# Patient Record
Sex: Female | Born: 1945 | Race: Black or African American | Hispanic: No | Marital: Married | Smoking: Never smoker
Health system: Southern US, Community
[De-identification: ages and names within clinical notes are randomized; demographics above are authoritative.]

## PROBLEM LIST (undated history)

## (undated) DIAGNOSIS — M810 Age-related osteoporosis without current pathological fracture: Secondary | ICD-10-CM

## (undated) DIAGNOSIS — D509 Iron deficiency anemia, unspecified: Secondary | ICD-10-CM

## (undated) DIAGNOSIS — I10 Essential (primary) hypertension: Secondary | ICD-10-CM

## (undated) DIAGNOSIS — E876 Hypokalemia: Secondary | ICD-10-CM

## (undated) DIAGNOSIS — C50919 Malignant neoplasm of unspecified site of unspecified female breast: Secondary | ICD-10-CM

## (undated) DIAGNOSIS — K219 Gastro-esophageal reflux disease without esophagitis: Secondary | ICD-10-CM

## (undated) HISTORY — PX: BREAST SURGERY: SHX581

## (undated) HISTORY — PX: CHOLECYSTECTOMY: SHX55

## (undated) HISTORY — PX: ABDOMINAL HYSTERECTOMY: SHX81

## (undated) HISTORY — PX: KNEE SURGERY: SHX244

---

## 2002-08-18 ENCOUNTER — Encounter: Admission: RE | Admit: 2002-08-18 | Discharge: 2002-08-18 | Payer: Self-pay | Admitting: Internal Medicine

## 2002-08-18 ENCOUNTER — Encounter: Payer: Self-pay | Admitting: Internal Medicine

## 2006-06-27 ENCOUNTER — Encounter: Admission: RE | Admit: 2006-06-27 | Discharge: 2006-06-27 | Payer: Self-pay | Admitting: Internal Medicine

## 2010-03-19 ENCOUNTER — Ambulatory Visit: Payer: Self-pay | Admitting: Diagnostic Radiology

## 2010-03-19 ENCOUNTER — Emergency Department (HOSPITAL_BASED_OUTPATIENT_CLINIC_OR_DEPARTMENT_OTHER): Admission: EM | Admit: 2010-03-19 | Discharge: 2010-03-19 | Payer: Self-pay | Admitting: Emergency Medicine

## 2014-07-09 ENCOUNTER — Emergency Department (HOSPITAL_BASED_OUTPATIENT_CLINIC_OR_DEPARTMENT_OTHER)
Admission: EM | Admit: 2014-07-09 | Discharge: 2014-07-10 | Disposition: A | Discharge status: Home / Self Care | Payer: Medicare Other | Attending: Emergency Medicine | Admitting: Emergency Medicine

## 2014-07-09 ENCOUNTER — Emergency Department (HOSPITAL_BASED_OUTPATIENT_CLINIC_OR_DEPARTMENT_OTHER): Payer: Medicare Other

## 2014-07-09 ENCOUNTER — Encounter (HOSPITAL_BASED_OUTPATIENT_CLINIC_OR_DEPARTMENT_OTHER): Payer: Self-pay

## 2014-07-09 DIAGNOSIS — S3992XA Unspecified injury of lower back, initial encounter: Secondary | ICD-10-CM | POA: Insufficient documentation

## 2014-07-09 DIAGNOSIS — M5441 Lumbago with sciatica, right side: Secondary | ICD-10-CM | POA: Insufficient documentation

## 2014-07-09 DIAGNOSIS — X58XXXA Exposure to other specified factors, initial encounter: Secondary | ICD-10-CM | POA: Insufficient documentation

## 2014-07-09 DIAGNOSIS — Y998 Other external cause status: Secondary | ICD-10-CM | POA: Insufficient documentation

## 2014-07-09 DIAGNOSIS — I10 Essential (primary) hypertension: Secondary | ICD-10-CM | POA: Insufficient documentation

## 2014-07-09 DIAGNOSIS — Z853 Personal history of malignant neoplasm of breast: Secondary | ICD-10-CM | POA: Diagnosis not present

## 2014-07-09 DIAGNOSIS — R52 Pain, unspecified: Secondary | ICD-10-CM

## 2014-07-09 DIAGNOSIS — D509 Iron deficiency anemia, unspecified: Secondary | ICD-10-CM | POA: Insufficient documentation

## 2014-07-09 DIAGNOSIS — Y92003 Bedroom of unspecified non-institutional (private) residence as the place of occurrence of the external cause: Secondary | ICD-10-CM | POA: Insufficient documentation

## 2014-07-09 DIAGNOSIS — Y9389 Activity, other specified: Secondary | ICD-10-CM | POA: Insufficient documentation

## 2014-07-09 DIAGNOSIS — Z8719 Personal history of other diseases of the digestive system: Secondary | ICD-10-CM | POA: Insufficient documentation

## 2014-07-09 DIAGNOSIS — E876 Hypokalemia: Secondary | ICD-10-CM | POA: Diagnosis not present

## 2014-07-09 DIAGNOSIS — Z79899 Other long term (current) drug therapy: Secondary | ICD-10-CM | POA: Insufficient documentation

## 2014-07-09 DIAGNOSIS — S79911A Unspecified injury of right hip, initial encounter: Secondary | ICD-10-CM | POA: Diagnosis present

## 2014-07-09 HISTORY — DX: Age-related osteoporosis without current pathological fracture: M81.0

## 2014-07-09 HISTORY — DX: Hypokalemia: E87.6

## 2014-07-09 HISTORY — DX: Iron deficiency anemia, unspecified: D50.9

## 2014-07-09 HISTORY — DX: Gastro-esophageal reflux disease without esophagitis: K21.9

## 2014-07-09 HISTORY — DX: Malignant neoplasm of unspecified site of unspecified female breast: C50.919

## 2014-07-09 HISTORY — DX: Essential (primary) hypertension: I10

## 2014-07-09 MED ORDER — KETOROLAC TROMETHAMINE 60 MG/2ML IM SOLN
30.0000 mg | Freq: Once | INTRAMUSCULAR | Status: AC
  Administered 2014-07-09: 30 mg via INTRAMUSCULAR
  Filled 2014-07-09: qty 2

## 2014-07-09 NOTE — ED Notes (Signed)
Pt reports last night awoke from sleep with pain in right hip area, reports unable to walk well d/t pain in left hip "grabbing" her.  Denies n/t or loss of bowel/bladder.  No trauma.

## 2014-07-09 NOTE — Discharge Instructions (Signed)
Back Pain, Adult °Back pain is very common. The pain often gets better over time. The cause of back pain is usually not dangerous. Most people can learn to manage their back pain on their own.  °HOME CARE  °· Stay active. Start with short walks on flat ground if you can. Try to walk farther each day. °· Do not sit, drive, or stand in one place for more than 30 minutes. Do not stay in bed. °· Do not avoid exercise or work. Activity can help your back heal faster. °· Be careful when you bend or lift an object. Bend at your knees, keep the object close to you, and do not twist. °· Sleep on a firm mattress. Lie on your side, and bend your knees. If you lie on your back, put a pillow under your knees. °· Only take medicines as told by your doctor. °· Put ice on the injured area. °¨ Put ice in a plastic bag. °¨ Place a towel between your skin and the bag. °¨ Leave the ice on for 15-20 minutes, 03-04 times a day for the first 2 to 3 days. After that, you can switch between ice and heat packs. °· Ask your doctor about back exercises or massage. °· Avoid feeling anxious or stressed. Find good ways to deal with stress, such as exercise. °GET HELP RIGHT AWAY IF:  °· Your pain does not go away with rest or medicine. °· Your pain does not go away in 1 week. °· You have new problems. °· You do not feel well. °· The pain spreads into your legs. °· You cannot control when you poop (bowel movement) or pee (urinate). °· Your arms or legs feel weak or lose feeling (numbness). °· You feel sick to your stomach (nauseous) or throw up (vomit). °· You have belly (abdominal) pain. °· You feel like you may pass out (faint). °MAKE SURE YOU:  °· Understand these instructions. °· Will watch your condition. °· Will get help right away if you are not doing well or get worse. °Document Released: 10/09/2007 Document Revised: 07/15/2011 Document Reviewed: 08/24/2013 °Exit[employer]® Patient Information ©2015 [employer]. This information is not intended  to replace advice given to you by your health care provider. Make sure you discuss any questions you have with your health care provider. ° °

## 2014-07-09 NOTE — ED Provider Notes (Signed)
CSN: [id number]     Arrival date & time 07/09/14  1944 History  This chart was scribed for Malvin Johns, MD by Dellis Filbert, ED Scribe. The patient was seen in [facility] and the patient's care was started at 10:31 PM.  Chief Complaint  Patient presents with  . Hip Pain   Patient is a 69 y.o. female presenting with hip pain. The history is provided by the patient. No language interpreter was used.  Hip Pain Pertinent negatives include no chest pain, no abdominal pain, no headaches and no shortness of breath.   HPI Comments: Carol Long is a 69 y.o. female who presents to the Emergency Department complaining of new right hip pain acute onset last night. Pt was in bed, when she turned over she aggravated her right hip. The pain does not radiate down her leg. Pain is worse with standing and walking. She does not have pain when sitting or laying down. She has taken nothing for relief. Pt denies numbness or weakness in her leg, urine/bowel incontinence, and abdominal pain. No kidney problems.  Past Medical History  Diagnosis Date  . Hypertension   . Hypokalemia   . Iron deficiency anemia   . Breast cancer   . GERD (gastroesophageal reflux disease)   . Osteoporosis    Past Surgical History  Procedure Laterality Date  . Breast surgery    . Abdominal hysterectomy    . Cholecystectomy    . Knee surgery     No family history on file. History  Substance Use Topics  . Smoking status: Never Smoker   . Smokeless tobacco: Not on file  . Alcohol Use: No   OB History    No data available     Review of Systems  Constitutional: Negative for fever, chills, diaphoresis and fatigue.  HENT: Negative for congestion, rhinorrhea and sneezing.   Eyes: Negative.   Respiratory: Negative for cough, chest tightness and shortness of breath.   Cardiovascular: Negative for chest pain and leg swelling.  Gastrointestinal: Negative for nausea, vomiting, abdominal pain, diarrhea and blood in stool.   Genitourinary: Negative for frequency, hematuria, flank pain and difficulty urinating.  Musculoskeletal: Positive for back pain and gait problem (pain with ambulation). Negative for arthralgias.  Skin: Negative for rash.  Neurological: Negative for dizziness, speech difficulty, weakness, numbness and headaches.   Allergies  Codeine; Hydrocodone; and Other  Home Medications   Prior to Admission medications   Medication Sig Start Date End Date Taking? Authorizing Provider  ferrous sulfate 325 (65 FE) MG tablet Take 325 mg by mouth daily with breakfast.   Yes Historical Provider, MD  losartan (COZAAR) 100 MG tablet Take 100 mg by mouth daily.   Yes Historical Provider, MD  potassium chloride (K-DUR) 10 MEQ tablet Take 10 mEq by mouth daily.   Yes Historical Provider, MD   BP 148/78 mmHg  Pulse 81  Temp(Src) 98.1 F (36.7 C) (Oral)  Resp 18  Ht 5\' 4"  (1.626 m)  Wt 225 lb (102.059 kg)  BMI 38.60 kg/m2  SpO2 100% Physical Exam  Constitutional: She is oriented to person, place, and time. She appears well-developed and well-nourished.  HENT:  Head: Normocephalic and atraumatic.  Eyes: Pupils are equal, round, and reactive to light.  Neck: Normal range of motion. Neck supple.  Cardiovascular: Normal rate, regular rhythm and normal heart sounds.   Pulmonary/Chest: Effort normal and breath sounds normal. No respiratory distress. She has no wheezes. She has no rales. She exhibits no  tenderness.  Abdominal: Soft. Bowel sounds are normal. There is no tenderness. There is no rebound and no guarding.  Musculoskeletal: Normal range of motion. She exhibits no edema.  There is positive tenderness to the posterior hip along the sciatic nerve area. There some tenderness around the SI joint area. There is no pain on range of motion the hip joint. There is no pain to the knee. She has normal sensation in the leg. There is normal motor function in the leg. There is no swelling of the leg.    Lymphadenopathy:    She has no cervical adenopathy.  Neurological: She is alert and oriented to person, place, and time.  Skin: Skin is warm and dry. No rash noted.  Psychiatric: She has a normal mood and affect.    ED Course  Procedures  DIAGNOSTIC STUDIES: Oxygen Saturation is 100% on room air, normal by my interpretation.    COORDINATION OF CARE: 10:43 PM Discussed treatment plan with pt at bedside and pt agreed to plan.  Labs Review Labs Reviewed - No data to display  Imaging Review Dg Hip Unilat With Pelvis 1v Right  07/09/2014   CLINICAL DATA:  Posterior right hip pain for 1 day. No known injury.  EXAM: RIGHT HIP (WITH PELVIS) 1 VIEW  COMPARISON:  06/24/2011 radiographs  FINDINGS: There is no evidence of acute fracture, subluxation or dislocation.  Mild degenerative changes in both hips noted.  Subchondral sclerosis along both SI joints, right greater than left, noted. No definite erosions along the SI joints are noted.  No other focal bony abnormalities are noted.  IMPRESSION: No evidence of acute bony abnormality.  Bilateral SI joint subchondral sclerosis without definite erosions. Question degenerative change versus sacroiliitis.  Mild degenerative changes in both hips.   Electronically Signed   By: Margarette Canada M.D.   On: 07/09/2014 21:45     EKG Interpretation None      MDM   Final diagnoses:  Right-sided low back pain with right-sided sciatica   Patient is given dose of Toradol in the ED. She doesn't want to use narcotic medicines because they make her sick. She will use ibuprofen at home for symptomatic relief. She was advised to follow-up with her primary care physician or orthopedist if her symptoms continue or return here as needed for any worsening symptoms. She currently has no neurologic dysfunctions or evidence of cauda equina. She has no bony tenderness in the hip joint or evidence of hip fracture.  I personally performed the services described in this  documentation, which was scribed in my presence.  The recorded information has been reviewed and considered.      Malvin Johns, MD 07/09/14 [phone]

## 2016-03-31 IMAGING — CR DG HIP (WITH OR WITHOUT PELVIS) 1V*R*
3 series · 3 of 3 positions shown · non-contrast
Comparison: 06/24/2011 radiographs

CLINICAL DATA: Posterior right hip pain for 1 day. No known injury.

EXAM:
RIGHT HIP (WITH PELVIS) 1 VIEW

[t pelvis a.p.]
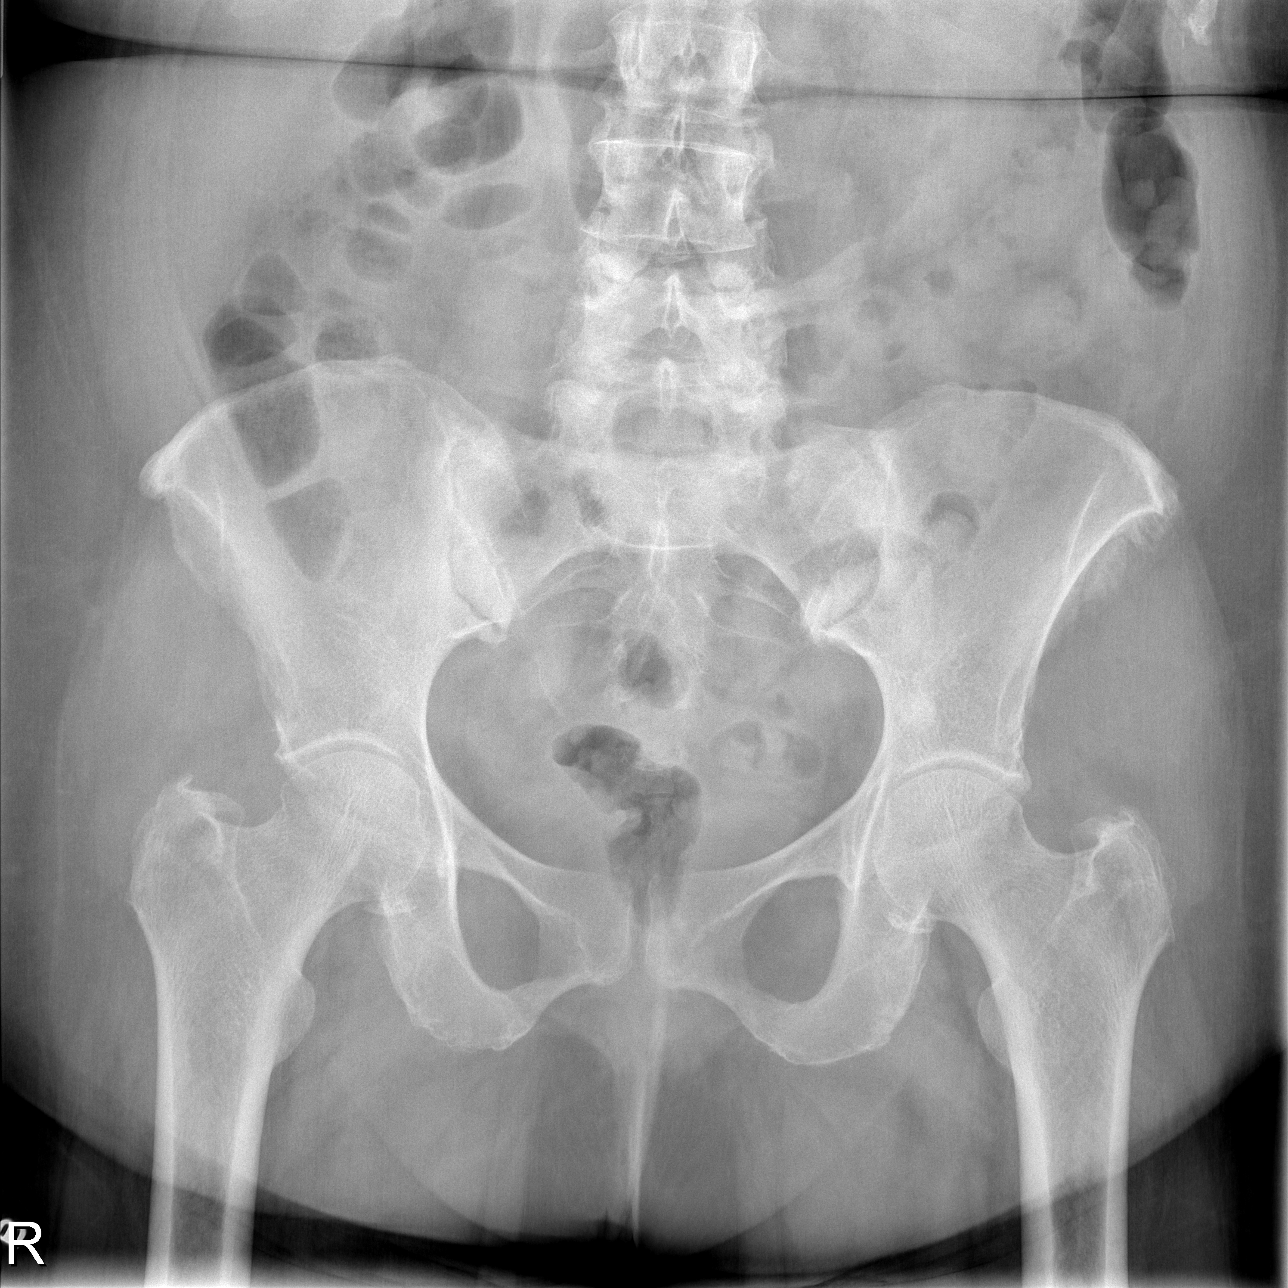

[t hip ap right]
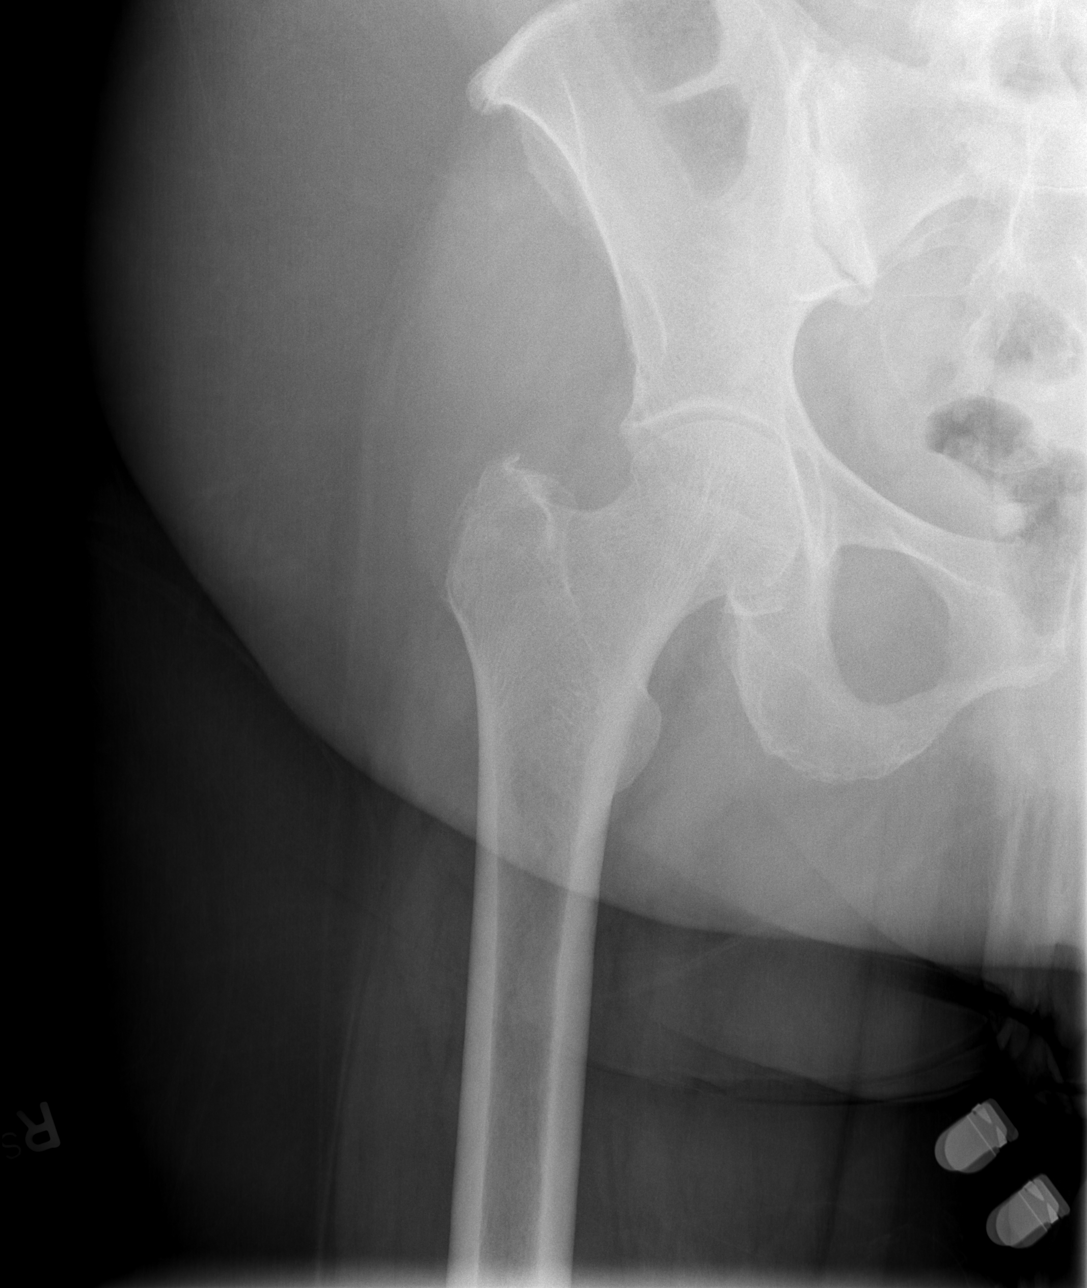

[t hip frog leg right]
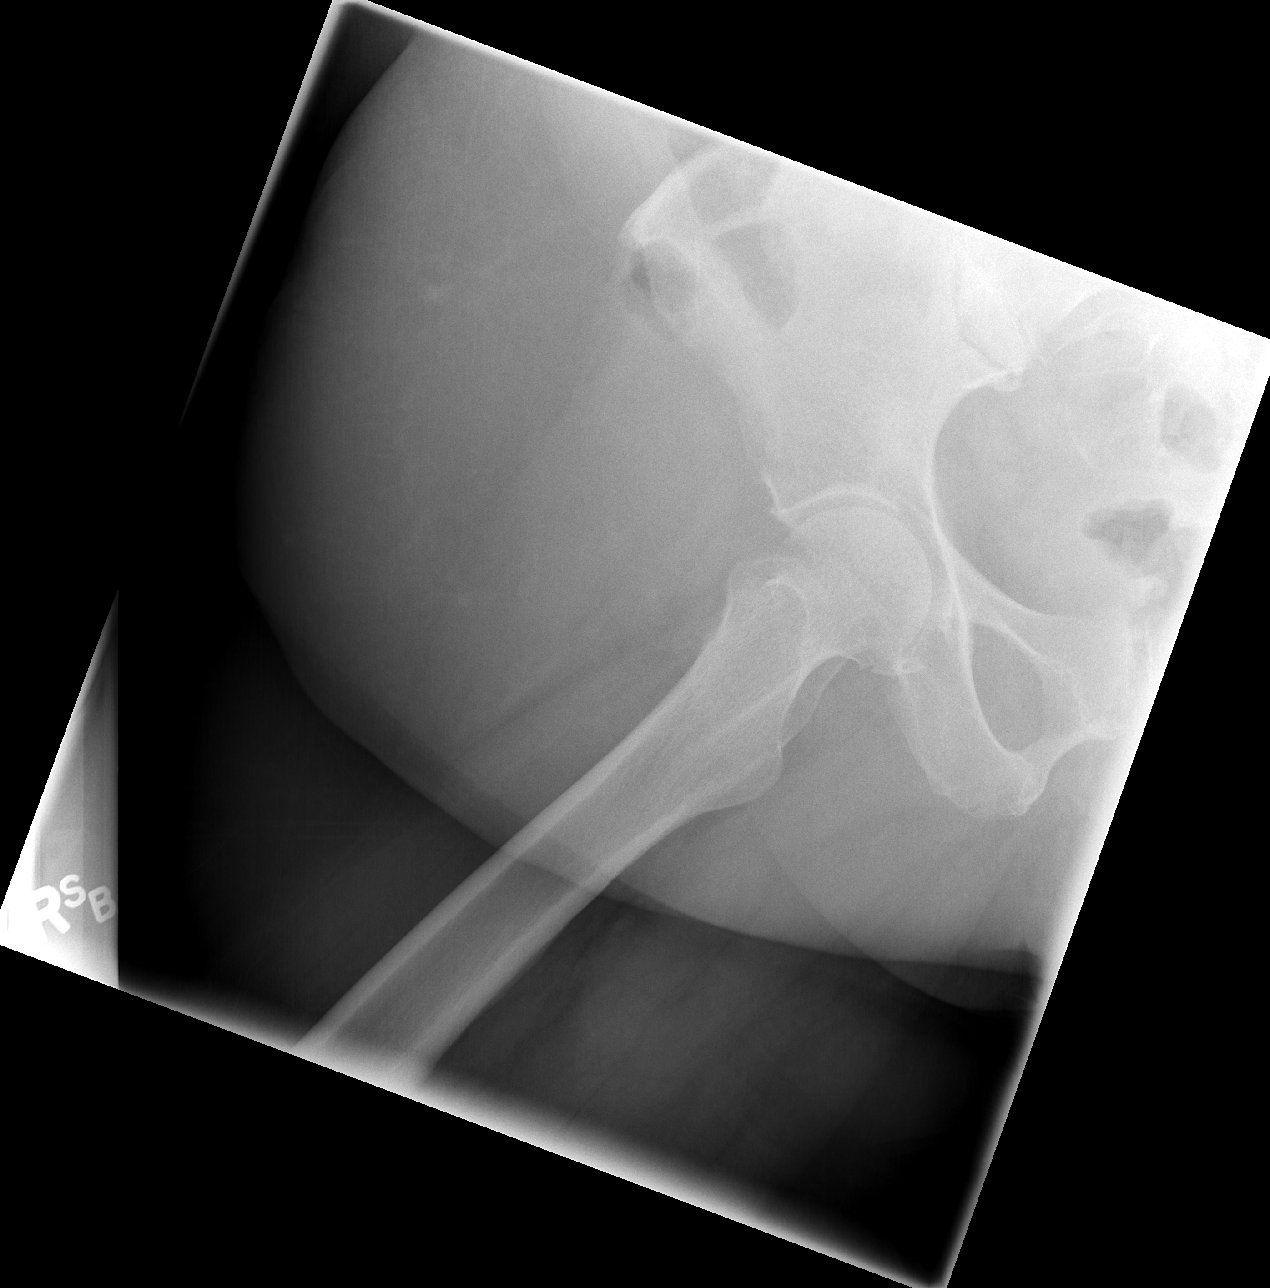

[3 of 3 positions shown; findings below may reference images not displayed]

FINDINGS: There is no evidence of acute fracture, subluxation or dislocation.

Mild degenerative changes in both hips noted.

Subchondral sclerosis along both SI joints, right greater than left,
noted. No definite erosions along the SI joints are noted.

No other focal bony abnormalities are noted.
IMPRESSION: No evidence of acute bony abnormality.

Bilateral SI joint subchondral sclerosis without definite erosions.
Question degenerative change versus sacroiliitis.

Mild degenerative changes in both hips.

## 2016-07-05 ENCOUNTER — Encounter (HOSPITAL_BASED_OUTPATIENT_CLINIC_OR_DEPARTMENT_OTHER): Payer: Self-pay | Admitting: *Deleted

## 2016-07-05 ENCOUNTER — Emergency Department (HOSPITAL_BASED_OUTPATIENT_CLINIC_OR_DEPARTMENT_OTHER)
Admission: EM | Admit: 2016-07-05 | Discharge: 2016-07-05 | Disposition: A | Discharge status: Home / Self Care | Payer: Medicare Other | Attending: Emergency Medicine | Admitting: Emergency Medicine

## 2016-07-05 ENCOUNTER — Emergency Department (HOSPITAL_BASED_OUTPATIENT_CLINIC_OR_DEPARTMENT_OTHER): Payer: Medicare Other

## 2016-07-05 DIAGNOSIS — I10 Essential (primary) hypertension: Secondary | ICD-10-CM | POA: Diagnosis not present

## 2016-07-05 DIAGNOSIS — Z853 Personal history of malignant neoplasm of breast: Secondary | ICD-10-CM | POA: Insufficient documentation

## 2016-07-05 DIAGNOSIS — M1711 Unilateral primary osteoarthritis, right knee: Secondary | ICD-10-CM | POA: Diagnosis not present

## 2016-07-05 DIAGNOSIS — M79604 Pain in right leg: Secondary | ICD-10-CM

## 2016-07-05 DIAGNOSIS — Z79899 Other long term (current) drug therapy: Secondary | ICD-10-CM | POA: Diagnosis not present

## 2016-07-05 MED ORDER — ACETAMINOPHEN 500 MG PO TABS
1000.0000 mg | ORAL_TABLET | Freq: Once | ORAL | Status: AC
  Administered 2016-07-05: 1000 mg via ORAL
  Filled 2016-07-05: qty 2

## 2016-07-05 NOTE — ED Notes (Signed)
ED Provider at bedside. 

## 2016-07-05 NOTE — ED Triage Notes (Signed)
Pt reports ongoing  Problems with right leg. Sees Dr. Linton Rump. C/o right leg pain and "locking up", worse x 3 days

## 2016-07-05 NOTE — ED Provider Notes (Signed)
[place] [facility] Provider Note   CSN: [id number] Arrival date & time: 07/05/16  0911     History   Chief Complaint Chief Complaint  Patient presents with  . Leg Pain    HPI Carol Long is a 71 y.o. female.  Patient c/o right anterior lower leg pain in past day. Pain constant, dull, mild-mod, worse w weight bearing and/or walking.  No numbness/weakness. No swelling. No skin changes, redness, or lesions. Denies posterior/calf pain. No claudication. No fever or chills. No trauma or fall.     Leg Pain   Pertinent negatives include no numbness.    Past Medical History:  Diagnosis Date  . Breast cancer ([place])   . GERD (gastroesophageal reflux disease)   . Hypertension   . Hypokalemia   . Iron deficiency anemia   . Osteoporosis     There are no active problems to display for this patient.   Past Surgical History:  Procedure Laterality Date  . ABDOMINAL HYSTERECTOMY    . BREAST SURGERY    . CHOLECYSTECTOMY    . KNEE SURGERY      OB History    No data available       Home Medications    Prior to Admission medications   Medication Sig Start Date End Date Taking? Authorizing Provider  losartan (COZAAR) 100 MG tablet Take 100 mg by mouth daily.   Yes Historical Provider, MD  potassium chloride (K-DUR) 10 MEQ tablet Take 10 mEq by mouth daily.   Yes Historical Provider, MD  ferrous sulfate 325 (65 FE) MG tablet Take 325 mg by mouth daily with breakfast.    Historical Provider, MD    Family History No family history on file.  Social History Social History  Substance Use Topics  . Smoking status: Never Smoker  . Smokeless tobacco: Never Used  . Alcohol use No     Allergies   Codeine; Hydrocodone; and Other   Review of Systems Review of Systems  Constitutional: Negative for fever.  Respiratory: Negative for shortness of breath.   Cardiovascular: Negative for chest pain and leg swelling.  Skin: Negative for rash.  Neurological: Negative for  weakness and numbness.     Physical Exam Updated Vital Signs BP 151/75 (BP Location: Right Arm)   Pulse 72   Temp 97.9 F (36.6 C) (Oral)   Resp 18   Ht 5\' 4"  (1.626 m)   Wt 95.7 kg   SpO2 99%   BMI 36.22 kg/m   Physical Exam  Constitutional: She appears well-developed and well-nourished. No distress.  Eyes: Conjunctivae are normal. No scleral icterus.  Neck: Neck supple. No tracheal deviation present.  Cardiovascular: Normal rate and intact distal pulses.   Pulmonary/Chest: Effort normal. No respiratory distress.  Abdominal: Normal appearance.  Musculoskeletal: She exhibits no edema.  Mild tenderness right anterior lower leg. No sts noted. No skin changes or lesions. Compartments of lower leg are soft, not tense. No pain w rom at knee or ankle. Dp/pt 2+.  Neurological: She is alert.  Motor/sens grossly intact. Ambulates w steady gait.   Skin: Skin is warm and dry. No rash noted. She is not diaphoretic.  Psychiatric: She has a normal mood and affect.  Nursing note and vitals reviewed.    ED Treatments / Results  Labs (all labs ordered are listed, but only abnormal results are displayed) Labs Reviewed - No data to display  EKG  EKG Interpretation None       Radiology Dg Tibia/fibula  Right  Result Date: 07/05/2016 CLINICAL DATA:  Diffuse pain EXAM: RIGHT TIBIA AND FIBULA - 2 VIEW COMPARISON:  None. FINDINGS: Frontal and lateral views obtained. There is soft tissue swelling in the lateral and anterior ankle regions. There is no acute fracture or dislocation. No abnormal periosteal reaction. There is moderate osteoarthritic change in the knee and ankle joint regions. A small calcification medial to the proximal tibia may represent residua of old trauma. There are inferior and posterior calcaneal spurs. IMPRESSION: No demonstrable acute fracture or dislocation. Rather marked soft tissue swelling in the lateral ankle region. Question old trauma medial to the proximal tibia.  There is osteoarthritic change in the knee and ankle joint regions with rather severe narrowing in the medial compartment of the knee. Calcaneal spurs present. Electronically Signed   By: Lowella Grip III M.D.   On: 07/05/2016 09:53    Procedures Procedures (including critical care time)  Medications Ordered in ED Medications  acetaminophen (TYLENOL) tablet 1,000 mg (not administered)     Initial Impression / Assessment and Plan / ED Course  I have reviewed the triage vital signs and the nursing notes.  Pertinent labs & imaging results that were available during my care of the patient were reviewed by me and considered in my medical decision making (see chart for details).  Had ibuprofen earlier.  Xrays.  Tylenol po.  Xray with OA right knee.  Patient currently appears stable for d/c.     Final Clinical Impressions(s) / ED Diagnoses   Final diagnoses:  None    New Prescriptions New Prescriptions   No medications on file     Lajean Saver, MD 07/05/16 1000

## 2016-07-05 NOTE — Discharge Instructions (Signed)
It was our pleasure to provide your ER care today - we hope that you feel better.  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  Take ibuprofen or aleve as need for pain.   You may also take acetaminophen as need for pain.  Follow up with your orthopedic doctor in 1-2 weeks if symptoms fail to improve/resolve.  Return to ER if worse, new symptoms, fevers, severe swelling, other concern.

## 2018-04-18 ENCOUNTER — Other Ambulatory Visit: Payer: Self-pay

## 2018-04-18 ENCOUNTER — Encounter (HOSPITAL_BASED_OUTPATIENT_CLINIC_OR_DEPARTMENT_OTHER): Payer: Self-pay | Admitting: Emergency Medicine

## 2018-04-18 ENCOUNTER — Emergency Department (HOSPITAL_BASED_OUTPATIENT_CLINIC_OR_DEPARTMENT_OTHER): Payer: Medicare Other

## 2018-04-18 DIAGNOSIS — Z853 Personal history of malignant neoplasm of breast: Secondary | ICD-10-CM | POA: Insufficient documentation

## 2018-04-18 DIAGNOSIS — Z79899 Other long term (current) drug therapy: Secondary | ICD-10-CM | POA: Insufficient documentation

## 2018-04-18 DIAGNOSIS — I10 Essential (primary) hypertension: Secondary | ICD-10-CM | POA: Diagnosis not present

## 2018-04-18 DIAGNOSIS — R05 Cough: Secondary | ICD-10-CM | POA: Insufficient documentation

## 2018-04-18 NOTE — ED Triage Notes (Signed)
Patient states that she has had a cough for the last 3 weeks - she reports that in the last 2 -3 days her PMD called something new in for her but the cough is worse and now she is having chest pain and sweating at night

## 2018-04-19 ENCOUNTER — Other Ambulatory Visit: Payer: Self-pay

## 2018-04-19 ENCOUNTER — Emergency Department (HOSPITAL_BASED_OUTPATIENT_CLINIC_OR_DEPARTMENT_OTHER)
Admission: EM | Admit: 2018-04-19 | Discharge: 2018-04-19 | Disposition: A | Discharge status: Home / Self Care | Payer: Medicare Other | Attending: Emergency Medicine | Admitting: Emergency Medicine

## 2018-04-19 DIAGNOSIS — R053 Chronic cough: Secondary | ICD-10-CM

## 2018-04-19 DIAGNOSIS — R05 Cough: Secondary | ICD-10-CM

## 2018-04-19 MED ORDER — DOXYCYCLINE HYCLATE 100 MG PO TABS
100.0000 mg | ORAL_TABLET | Freq: Once | ORAL | Status: AC
  Administered 2018-04-19: 100 mg via ORAL
  Filled 2018-04-19: qty 1

## 2018-04-19 MED ORDER — DOXYCYCLINE HYCLATE 100 MG PO CAPS: 100.0000 mg | ORAL_CAPSULE | Freq: Two times a day (BID) | ORAL | 0 refills | Status: AC

## 2018-04-19 NOTE — ED Provider Notes (Signed)
[place] Provider Note: Georgena Spurling, MD, FACEP  CSN: [id number] MRN: [id number] ARRIVAL: 04/18/18 at 2335 ROOM: [place]  Cough   HISTORY OF PRESENT ILLNESS  04/19/18 4:43 AM Carol Long is a 72 y.o. female with a 3-week history of cough.  She has been taking Mucinex DM.  She has an albuterol inhaler she has been using.  She was placed on benzonatate capsules without relief.  She was then started on montelukast several days ago without relief.  She describes the cough is persistent and productive of white sputum.  She has not had a fever.  She denies being short of breath.  She has had night sweats.  She is having posterior rib pain when she coughs.  She states she is not able to take opioid antitussive such as codeine or hydrocodone.   Past Medical History:  Diagnosis Date  . Breast cancer ([place])   . GERD (gastroesophageal reflux disease)   . Hypertension   . Hypokalemia   . Iron deficiency anemia   . Osteoporosis     Past Surgical History:  Procedure Laterality Date  . ABDOMINAL HYSTERECTOMY    . BREAST SURGERY    . CHOLECYSTECTOMY    . KNEE SURGERY      History reviewed. No pertinent family history.  Social History   Tobacco Use  . Smoking status: Never Smoker  . Smokeless tobacco: Never Used  Substance Use Topics  . Alcohol use: No  . Drug use: No    Prior to Admission medications   Medication Sig Start Date End Date Taking? Authorizing Provider  ferrous sulfate 325 (65 FE) MG tablet Take 325 mg by mouth daily with breakfast.   Yes [provider]  losartan (COZAAR) 100 MG tablet Take 100 mg by mouth daily.   Yes [provider]  montelukast (SINGULAIR) 10 MG tablet Take 10 mg by mouth at bedtime.   Yes [provider]  potassium chloride (K-DUR) 10 MEQ tablet Take 10 mEq by mouth daily.   Yes [provider]    Allergies Codeine; Hydrocodone; and Other   REVIEW OF SYSTEMS    Negative except as noted here or in the History of Present Illness.   PHYSICAL EXAMINATION  Initial Vital Signs Blood pressure (!) 158/74, pulse 73, temperature 98.4 F (36.9 C), temperature source Oral, resp. rate 18, height 5\' 4"  (1.626 m), weight 97.5 kg, SpO2 98 %.  Examination General: Well-developed, well-nourished female in no acute distress; appearance consistent with age of record HENT: normocephalic; atraumatic Eyes: pupils equal, round and reactive to light; extraocular muscles intact Neck: supple Heart: regular rate and rhythm Lungs: clear to auscultation bilaterally Abdomen: soft; nondistended; nontender; bowel sounds present Extremities: No deformity; full range of motion; pulses normal Neurologic: Awake, alert and oriented; motor function intact in all extremities and symmetric; no facial droop Skin: Warm and dry Psychiatric: Normal mood and affect   RESULTS  Summary of this visit's results, reviewed by myself:   EKG Interpretation  Date/Time:  Sunday April 19 2018 01:06:53 EST Ventricular Rate:  76 PR Interval:  128 QRS Duration: 90 QT Interval:  380 QTC Calculation: 427 R Axis:   30 Text Interpretation:  Normal sinus rhythm with sinus arrhythmia T wave abnormality, consider inferior ischemia Abnormal ECG No previous ECGs available Confirmed by Shanon Rosser [phone]) on 04/19/2018 1:13:11 AM      Laboratory Studies: No results found for this or any previous visit (from the  past 24 hour(s)). Imaging Studies: Dg Chest 2 View  Result Date: 04/19/2018 CLINICAL DATA:  Cough and congestion for 3 weeks. EXAM: CHEST - 2 VIEW COMPARISON:  03/19/2010 FINDINGS: Mild enlargement of the cardiopericardial silhouette, without edema. Thoracic spondylosis. Calcified granuloma in the left lung. No pleural effusion. The lungs appear otherwise clear. IMPRESSION: 1. Mild enlargement of the cardiopericardial silhouette, without edema. 2. Old granulomatous disease. 3. Thoracic  spondylosis. Electronically Signed   By: Van Clines M.D.   On: 04/19/2018 00:26    ED COURSE and MDM  Nursing notes and initial vitals signs, including pulse oximetry, reviewed.  Vitals:   04/18/18 2349 04/18/18 2350 04/19/18 0236  BP: (!) 155/93  (!) 158/74  Pulse: 87  73  Resp: 18  18  Temp: 98.3 F (36.8 C)  98.4 F (36.9 C)  TempSrc: Oral  Oral  SpO2: 97%  98%  Weight:  97.5 kg   Height:  5\' 4"  (1.626 m)    The patient is already taking Mucinex with dextromethorphan, has an albuterol inhaler and is taking montelukast.  We will try her on a course of doxycycline for possible bacterial superinfection.  PROCEDURES    ED DIAGNOSES     ICD-10-CM   1. Persistent cough R05        Coulton Schlink, MD 04/19/18 1914

## 2018-04-19 NOTE — ED Notes (Signed)
Patient left at this time with all belongings. 

## 2019-07-06 IMAGING — CR DG CHEST 2V
2 series · 2 of 2 positions shown · non-contrast
Comparison: 03/19/2010

CLINICAL DATA: Cough and congestion for 3 weeks.

EXAM:
CHEST - 2 VIEW

[w chest pa]
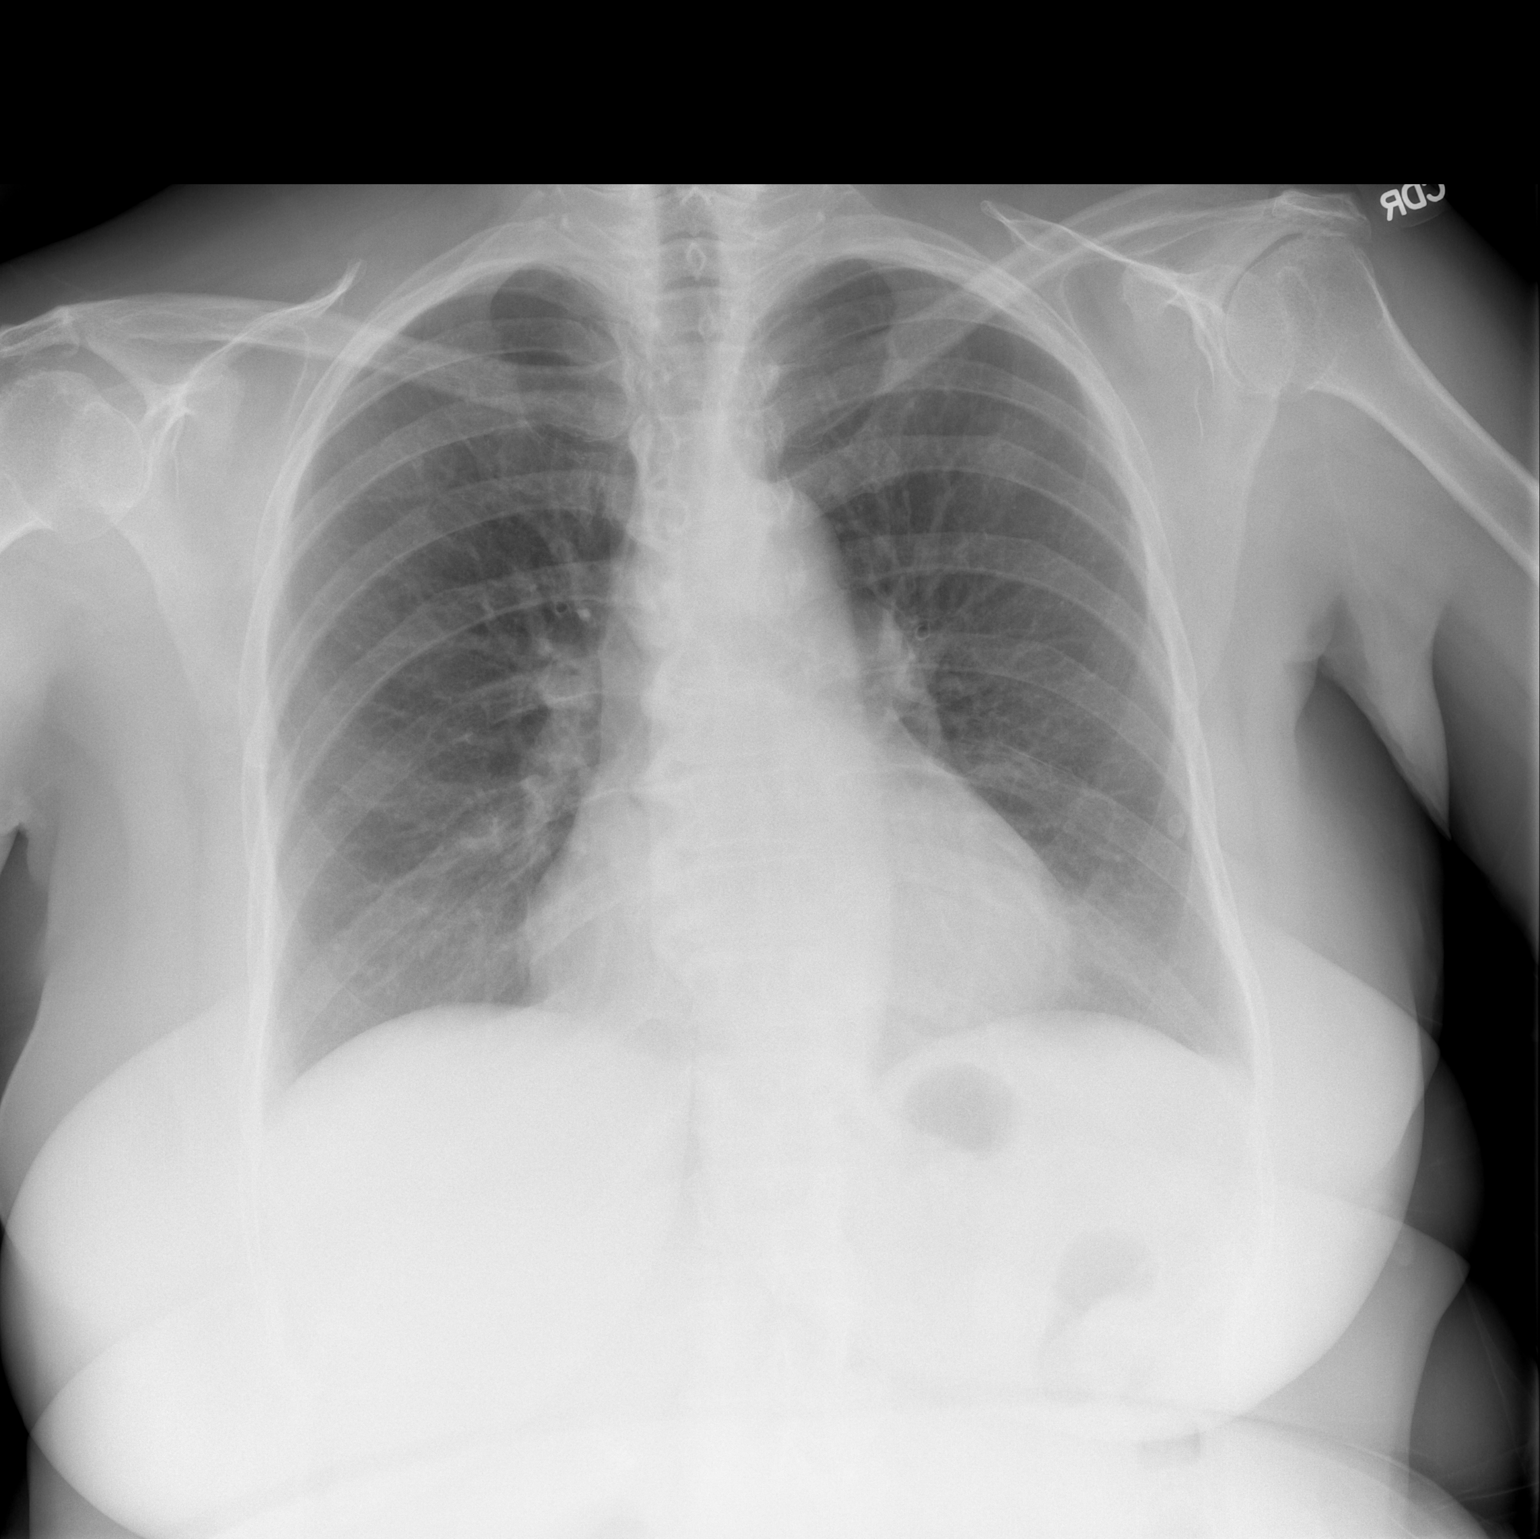

[w chest lat]
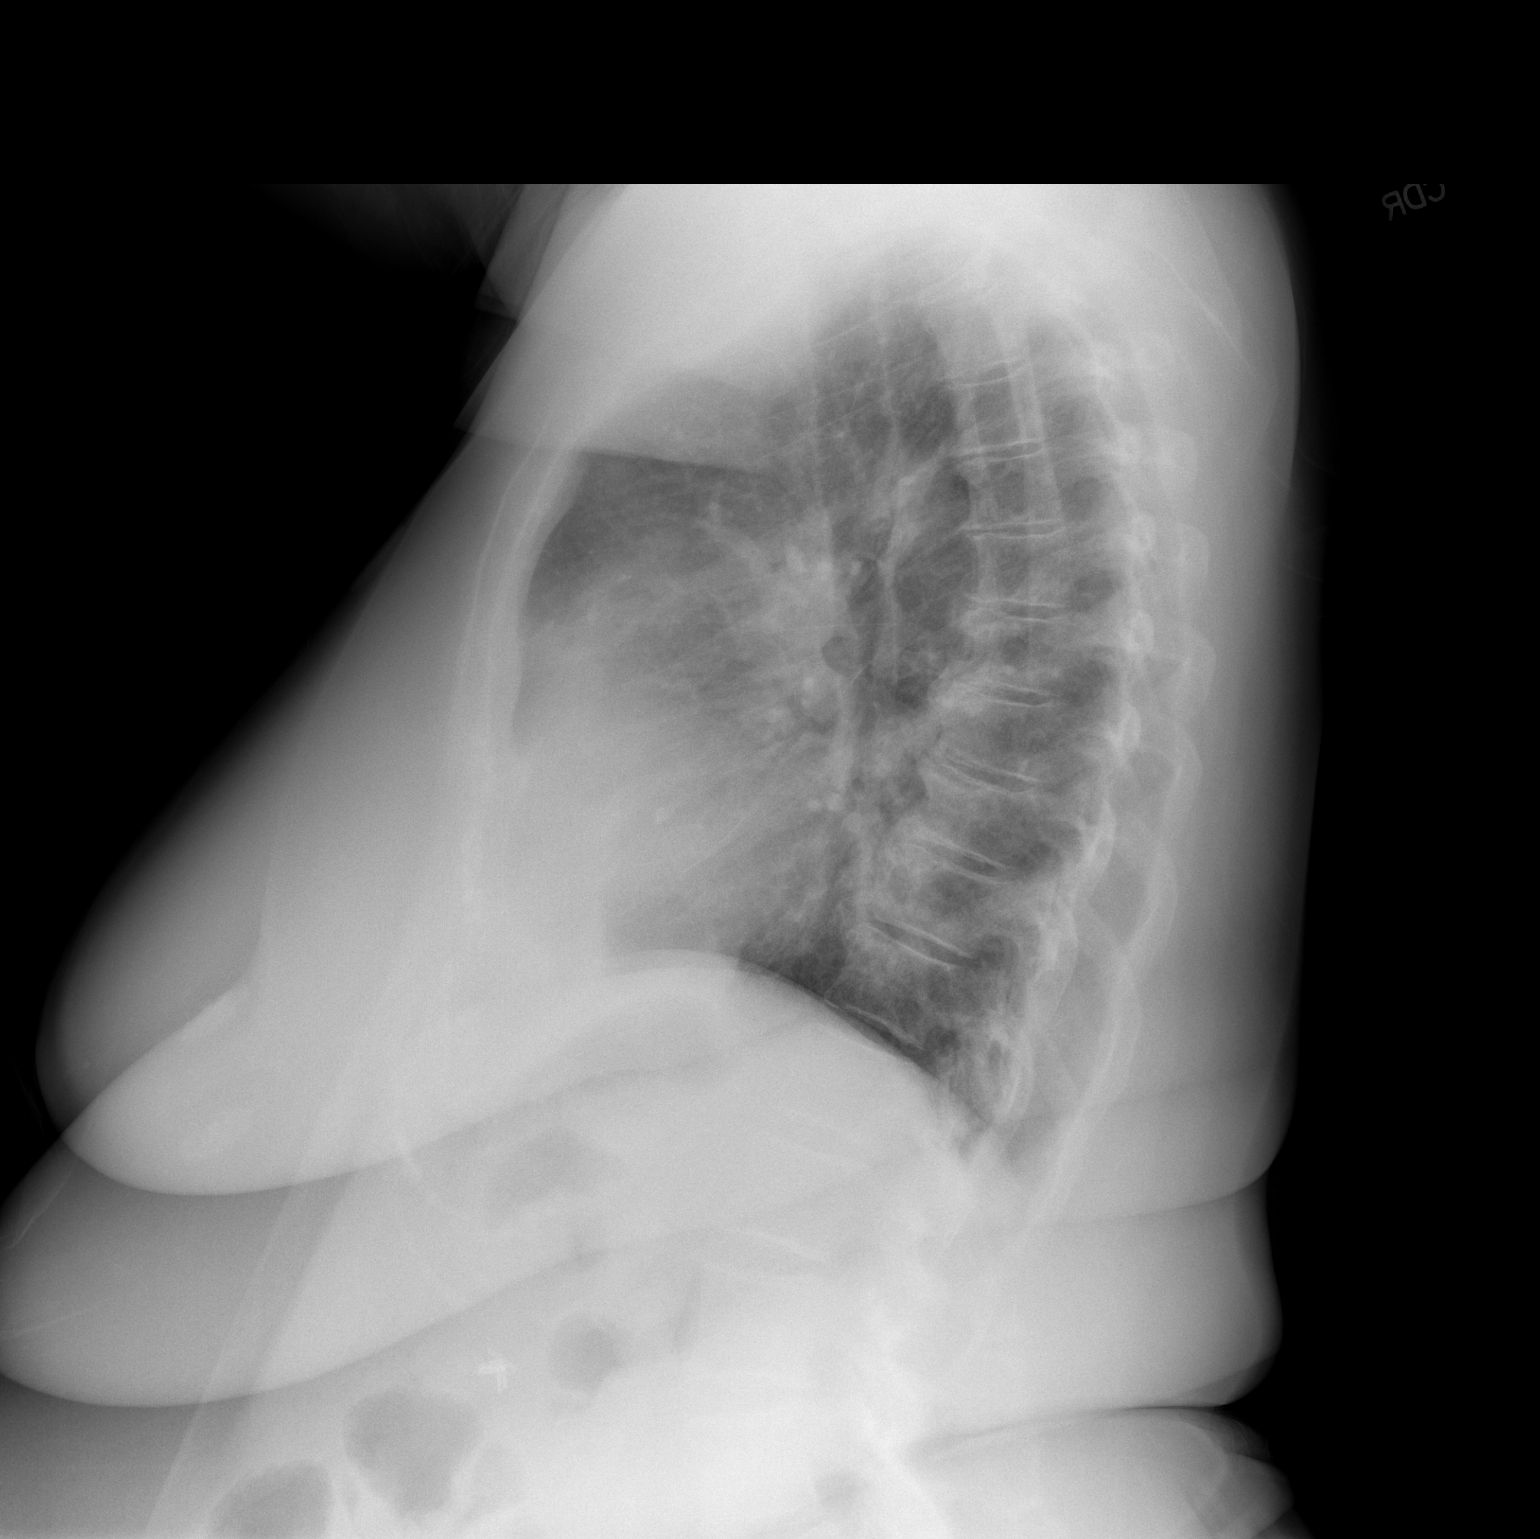

[2 of 2 positions shown; findings below may reference images not displayed]

FINDINGS: Mild enlargement of the cardiopericardial silhouette, without edema.
Thoracic spondylosis. Calcified granuloma in the left lung. No
pleural effusion. The lungs appear otherwise clear.
IMPRESSION: 1. Mild enlargement of the cardiopericardial silhouette, without
edema.
2. Old granulomatous disease.
3. Thoracic spondylosis.
